# Patient Record
Sex: Male | Born: 1985 | Race: White | Hispanic: No | Marital: Married
Health system: Southern US, Community
[De-identification: ages and names within clinical notes are randomized; demographics above are authoritative.]

---

## 2021-10-05 NOTE — Progress Notes (Signed)
Erroneous encounter

## 2021-10-14 ENCOUNTER — Other Ambulatory Visit: Payer: Self-pay | Admitting: Physician Assistant

## 2021-10-14 ENCOUNTER — Encounter: Payer: Self-pay | Admitting: Family

## 2021-10-14 DIAGNOSIS — M25511 Pain in right shoulder: Secondary | ICD-10-CM

## 2021-10-14 DIAGNOSIS — Z7689 Persons encountering health services in other specified circumstances: Secondary | ICD-10-CM

## 2021-10-25 ENCOUNTER — Other Ambulatory Visit: Payer: Self-pay

## 2021-10-25 ENCOUNTER — Other Ambulatory Visit: Payer: No Typology Code available for payment source

## 2021-10-25 ENCOUNTER — Other Ambulatory Visit: Payer: Self-pay | Admitting: Physician Assistant

## 2021-10-25 ENCOUNTER — Ambulatory Visit
Admission: RE | Admit: 2021-10-25 | Discharge: 2021-10-25 | Disposition: A | Discharge status: Home / Self Care | Payer: No Typology Code available for payment source | Source: Ambulatory Visit | Attending: Physician Assistant | Admitting: Physician Assistant

## 2021-10-25 DIAGNOSIS — R52 Pain, unspecified: Secondary | ICD-10-CM

## 2021-11-11 ENCOUNTER — Other Ambulatory Visit: Payer: Self-pay | Admitting: Family

## 2021-11-11 ENCOUNTER — Other Ambulatory Visit: Payer: Self-pay | Admitting: Nurse Practitioner

## 2021-11-11 DIAGNOSIS — M25511 Pain in right shoulder: Secondary | ICD-10-CM

## 2021-11-15 ENCOUNTER — Ambulatory Visit
Admission: RE | Admit: 2021-11-15 | Discharge: 2021-11-15 | Disposition: A | Discharge status: Home / Self Care | Payer: No Typology Code available for payment source | Source: Ambulatory Visit | Attending: Family | Admitting: Family

## 2021-11-15 ENCOUNTER — Other Ambulatory Visit: Payer: Self-pay | Admitting: Family

## 2021-11-15 DIAGNOSIS — M25511 Pain in right shoulder: Secondary | ICD-10-CM

## 2021-11-18 ENCOUNTER — Other Ambulatory Visit: Payer: No Typology Code available for payment source

## 2022-03-06 ENCOUNTER — Emergency Department (HOSPITAL_COMMUNITY): Payer: No Typology Code available for payment source

## 2022-03-06 ENCOUNTER — Emergency Department (HOSPITAL_COMMUNITY)
Admission: EM | Admit: 2022-03-06 | Discharge: 2022-03-07 | Discharge status: Against Medical Advice | Payer: No Typology Code available for payment source | Attending: Emergency Medicine | Admitting: Emergency Medicine

## 2022-03-06 DIAGNOSIS — F17210 Nicotine dependence, cigarettes, uncomplicated: Secondary | ICD-10-CM | POA: Diagnosis not present

## 2022-03-06 DIAGNOSIS — R5383 Other fatigue: Secondary | ICD-10-CM | POA: Insufficient documentation

## 2022-03-06 DIAGNOSIS — R0789 Other chest pain: Secondary | ICD-10-CM | POA: Diagnosis not present

## 2022-03-06 DIAGNOSIS — M549 Dorsalgia, unspecified: Secondary | ICD-10-CM | POA: Insufficient documentation

## 2022-03-06 DIAGNOSIS — Z5321 Procedure and treatment not carried out due to patient leaving prior to being seen by health care provider: Secondary | ICD-10-CM | POA: Insufficient documentation

## 2022-03-06 DIAGNOSIS — K921 Melena: Secondary | ICD-10-CM | POA: Diagnosis not present

## 2022-03-06 NOTE — ED Provider Triage Note (Signed)
  Emergency Medicine Provider Triage Evaluation Note  MRN:  [id number]  Arrival date & time: 03/06/22    Medically screening exam initiated at 11:39 PM.   CC:   Chest Pain  HPI:  Peter Mitchell is a 36 y.o. year-old male presents to the ED with chief complaint of chest pain and tightness that started this evening.  Denies SOB.  States that he has some pains in his back.  Denies fever.  Took ibuprofen.  History provided by patient. ROS:  -As included in HPI PE:   Vitals:   03/06/22 2330  BP: 123/89  Pulse: (!) 107  Resp: 20  Temp: 98.2 F (36.8 C)  SpO2: 97%    Non-toxic appearing No respiratory distress  MDM:  Based on signs and symptoms, ACS is highest on my differential, followed by PE. I've ordered labs in triage to expedite lab/diagnostic workup.  Patient was informed that the remainder of the evaluation will be completed by another provider, this initial triage assessment does not replace that evaluation, and the importance of remaining in the ED until their evaluation is complete.    Roxy Horseman, PA-C 03/06/22 2341

## 2022-03-06 NOTE — ED Triage Notes (Signed)
Pt c/o L sided CP & pain between shoulder blades onset 1hr ago. Also endorses increased fatigue & recent stressors. Familial hx- father MI in 13's. Pt is 1PPD smoker  Pt also endorses 2 episodes of black tarry stool

## 2022-03-07 LAB — CBC
HCT: 46.6 % (ref 39.0–52.0)
Hemoglobin: 15.7 g/dL (ref 13.0–17.0)
MCH: 31.4 pg (ref 26.0–34.0)
MCHC: 33.7 g/dL (ref 30.0–36.0)
MCV: 93.2 fL (ref 80.0–100.0)
Platelets: 310 10*3/uL (ref 150–400)
RBC: 5 MIL/uL (ref 4.22–5.81)
RDW: 12.3 % (ref 11.5–15.5)
WBC: 9 10*3/uL (ref 4.0–10.5)
nRBC: 0 % (ref 0.0–0.2)

## 2022-03-07 LAB — BASIC METABOLIC PANEL
Anion gap: 8 (ref 5–15)
BUN: 12 mg/dL (ref 6–20)
CO2: 24 mmol/L (ref 22–32)
Calcium: 9.2 mg/dL (ref 8.9–10.3)
Chloride: 107 mmol/L (ref 98–111)
Creatinine, Ser: 0.85 mg/dL (ref 0.61–1.24)
GFR, Estimated: >60 mL/min (ref >60)
Glucose, Bld: 171 mg/dL — ABNORMAL HIGH (ref 70–99)
Potassium: 4 mmol/L (ref 3.5–5.1)
Sodium: 139 mmol/L (ref 135–145)

## 2022-03-07 LAB — TROPONIN I (HIGH SENSITIVITY)
Troponin I (High Sensitivity): 3 ng/L (ref <18)
Troponin I (High Sensitivity): 3 ng/L (ref <18)

## 2022-03-07 NOTE — ED Notes (Signed)
Pt left to go home  

## 2022-08-06 ENCOUNTER — Emergency Department (HOSPITAL_COMMUNITY)
Admission: EM | Admit: 2022-08-06 | Discharge: 2022-08-06 | Disposition: A | Discharge status: Home / Self Care | Payer: No Typology Code available for payment source | Attending: Emergency Medicine | Admitting: Emergency Medicine

## 2022-08-06 ENCOUNTER — Encounter (HOSPITAL_COMMUNITY): Payer: Self-pay

## 2022-08-06 DIAGNOSIS — L02211 Cutaneous abscess of abdominal wall: Secondary | ICD-10-CM | POA: Diagnosis present

## 2022-08-06 DIAGNOSIS — L0291 Cutaneous abscess, unspecified: Secondary | ICD-10-CM

## 2022-08-06 MED ORDER — DOXYCYCLINE HYCLATE 100 MG PO CAPS: 100.0000 mg | ORAL_CAPSULE | Freq: Two times a day (BID) | ORAL | 0 refills | Status: AC

## 2022-08-06 NOTE — ED Provider Notes (Signed)
[facility]-EMERGENCY DEPT Provider Note   CSN: [id number] Arrival date & time: 08/06/22  1545     History  Chief Complaint  Patient presents with   Abscess    Peter Mitchell is a 36 y.o. male.  Patient with no pertinent past medical history presents today with complaints of right-sided abdominal abscess.  He states that same has been present for over a week and ruptured a few days ago.  States that same is continued to ooze intermittently.  States he has had abscesses in the past and would like antibiotics. He denies fevers, chills, nausea, vomiting, or diarrhea.  The history is provided by the patient. No language interpreter was used.  Abscess      Home Medications Prior to Admission medications   Not on File      Allergies    Patient has no known allergies.    Review of Systems   Review of Systems  Skin:  Positive for wound.  All other systems reviewed and are negative.   Physical Exam Updated Vital Signs BP 127/76 (BP Location: Left Arm)   Pulse 92   Temp 98.1 F (36.7 C) (Oral)   Resp 16   SpO2 98%  Physical Exam Vitals and nursing note reviewed.  Constitutional:      General: He is not in acute distress.    Appearance: Normal appearance. He is normal weight. He is not ill-appearing, toxic-appearing or diaphoretic.  HENT:     Head: Normocephalic and atraumatic.  Cardiovascular:     Rate and Rhythm: Normal rate.  Pulmonary:     Effort: Pulmonary effort is normal. No respiratory distress.  Musculoskeletal:        General: Normal range of motion.     Cervical back: Normal range of motion.  Skin:    General: Skin is warm and dry.     Comments: Small superficial wound noted to the left anterior abdomen with trace purulent drainage consistent with an ruptured abscess. Q-tip placed in the wound which touches the base approximately 1 mm in depth. Same probed and deloculated without additional drainage. Minimal surrounding  induration  Neurological:     General: No focal deficit present.     Mental Status: He is alert.  Psychiatric:        Mood and Affect: Mood normal.        Behavior: Behavior normal.     ED Results / Procedures / Treatments   Labs (all labs ordered are listed, but only abnormal results are displayed) Labs Reviewed - No data to display  EKG None  Radiology No results found.  Procedures Procedures    Medications Ordered in ED Medications - No data to display  ED Course/ Medical Decision Making/ A&P                           Medical Decision Making  Patient presents today with complaints of left superficial abdominal abscess.  He is afebrile, nontoxic-appearing, in no acute distress with reassuring vital signs.  Physical exam reveals superficial lesion which is open and has trace amounts of purulence consistent with a superficial abscess.  I did place a Q-tip in the wound and probed and deloculated approximately 1 mm in depth.  No concern for tracking abscess into the abdominal cavity. Will give doxycycline for antibiotic coverage per patients request. I  have also given the patient Q-tips to continue to probe and deloculate within the wound  to ensure adequate healing.  I did consider further evaluation left labs and CT imaging, however clearly superficial in nature.  Therefore will defer additional workup at this time.  Patient is stable for discharge.  He is understanding and amenable with plan, educated on red flag symptoms that would prompt immediate return.  Emphasized importance of close follow-up for wound check.  Patient discharged in stable condition.   Final Clinical Impression(s) / ED Diagnoses Final diagnoses:  Abscess    Rx / DC Orders ED Discharge Orders          Ordered    doxycycline (VIBRAMYCIN) 100 MG capsule  2 times daily        08/06/22 1711          An After Visit Summary was printed and given to the patient.     Vear Clock 08/06/22  1713    Ernie Avena, MD 08/06/22 2135

## 2022-08-06 NOTE — ED Provider Triage Note (Signed)
Emergency Medicine Provider Triage Evaluation Note  Peter Mitchell , a 36 y.o. male  was evaluated in triage.  Pt complains of abscess in the lower right portion of his abdomen.  Patient states has been some drainage and the wound had scabbed over.  He states he excellently knocked the scab loose today.  He is here with concerns about antibiotic coverage  Review of Systems  Positive: As above Negative: As above  Physical Exam  BP 127/76 (BP Location: Left Arm)   Pulse 92   Temp 98.1 F (36.7 C) (Oral)   Resp 16   SpO2 98%  Gen:   Awake, no distress   Resp:  Normal effort  MSK:   Moves extremities without difficulty  Other:    Medical Decision Making  Medically screening exam initiated at 4:33 PM.  Appropriate orders placed.  Peter Mitchell was informed that the remainder of the evaluation will be completed by another provider, this initial triage assessment does not replace that evaluation, and the importance of remaining in the ED until their evaluation is complete.     Darrick Grinder, PA-C 08/06/22 1635

## 2022-08-06 NOTE — Discharge Instructions (Addendum)
As we discussed, I have given you a prescription for an antibiotic for you to take as prescribed in its entirety to help with the healing of your skin abscess.  I have also given you Q-tips to place in the wound every day to ensure adequate healing.  Please follow-up with your primary care doctor in the next few days for a wound check.  Return if development of any new or worsening symptoms.

## 2023-08-17 IMAGING — CR DG SHOULDER 2+V*R*
4 series · 4 of 4 positions shown · non-contrast
Comparison: None.

CLINICAL DATA: Right shoulder pain, limited range of motion for 1
year

EXAM:
RIGHT SHOULDER - 2+ VIEW

[w shoulder grashey right]
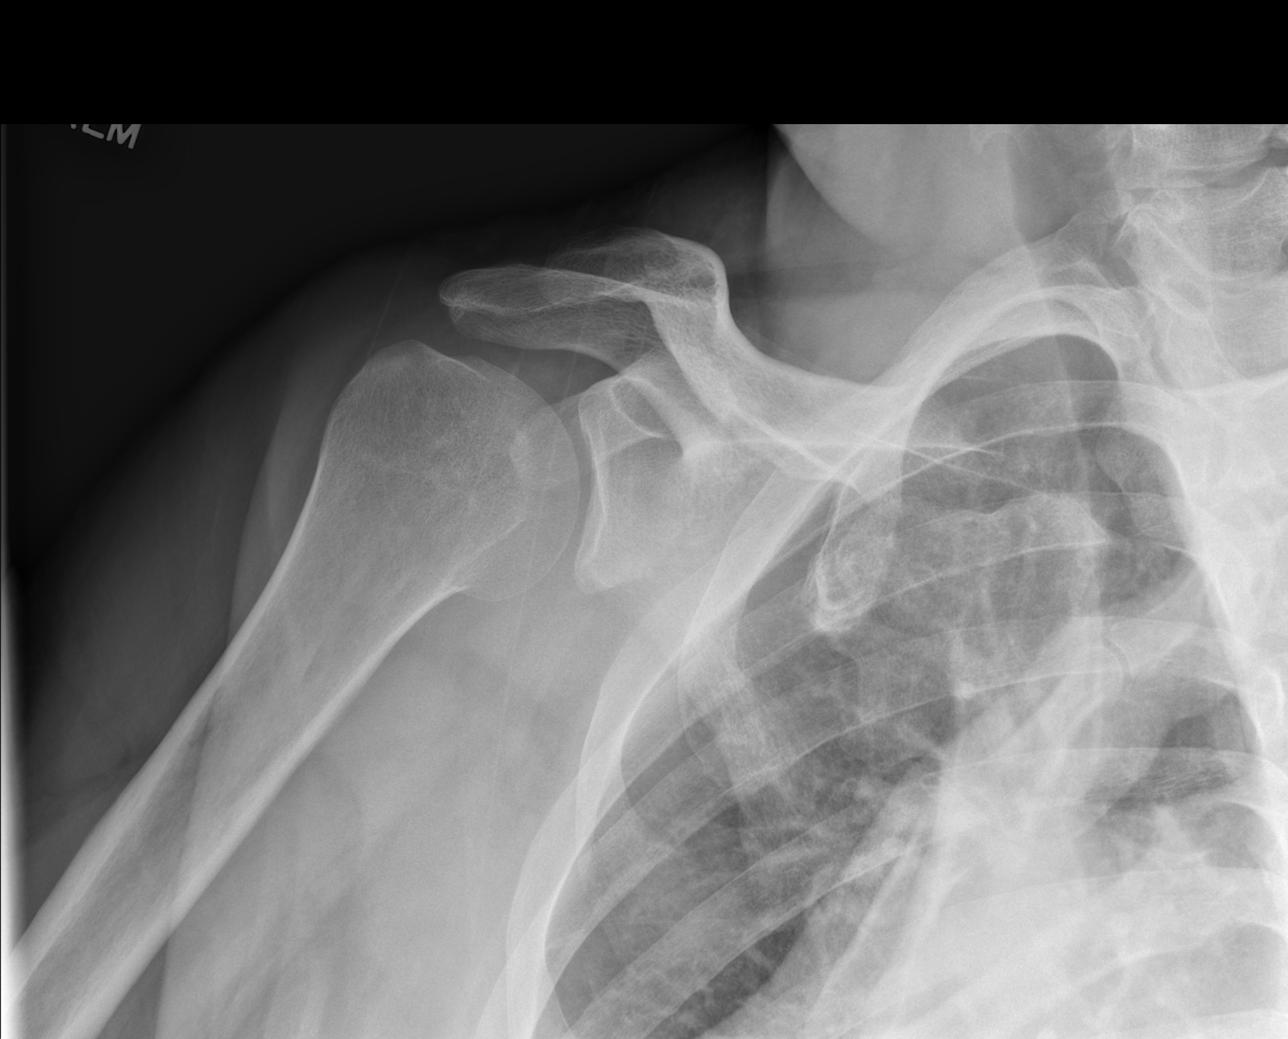

[w shoulder y-view right]
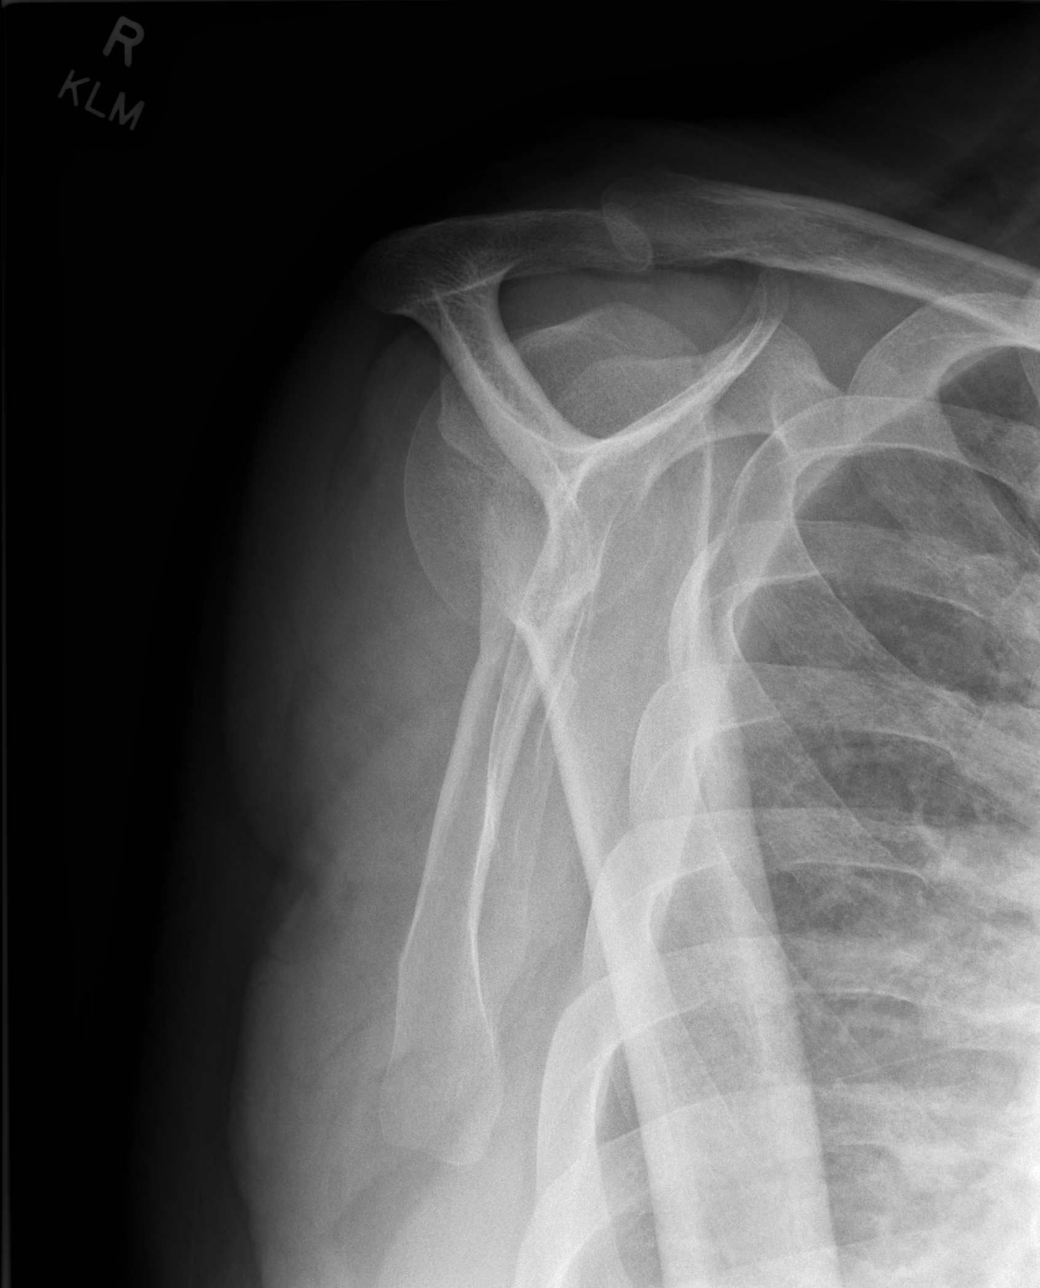

[x shoulder axillary right (1 of 2)]
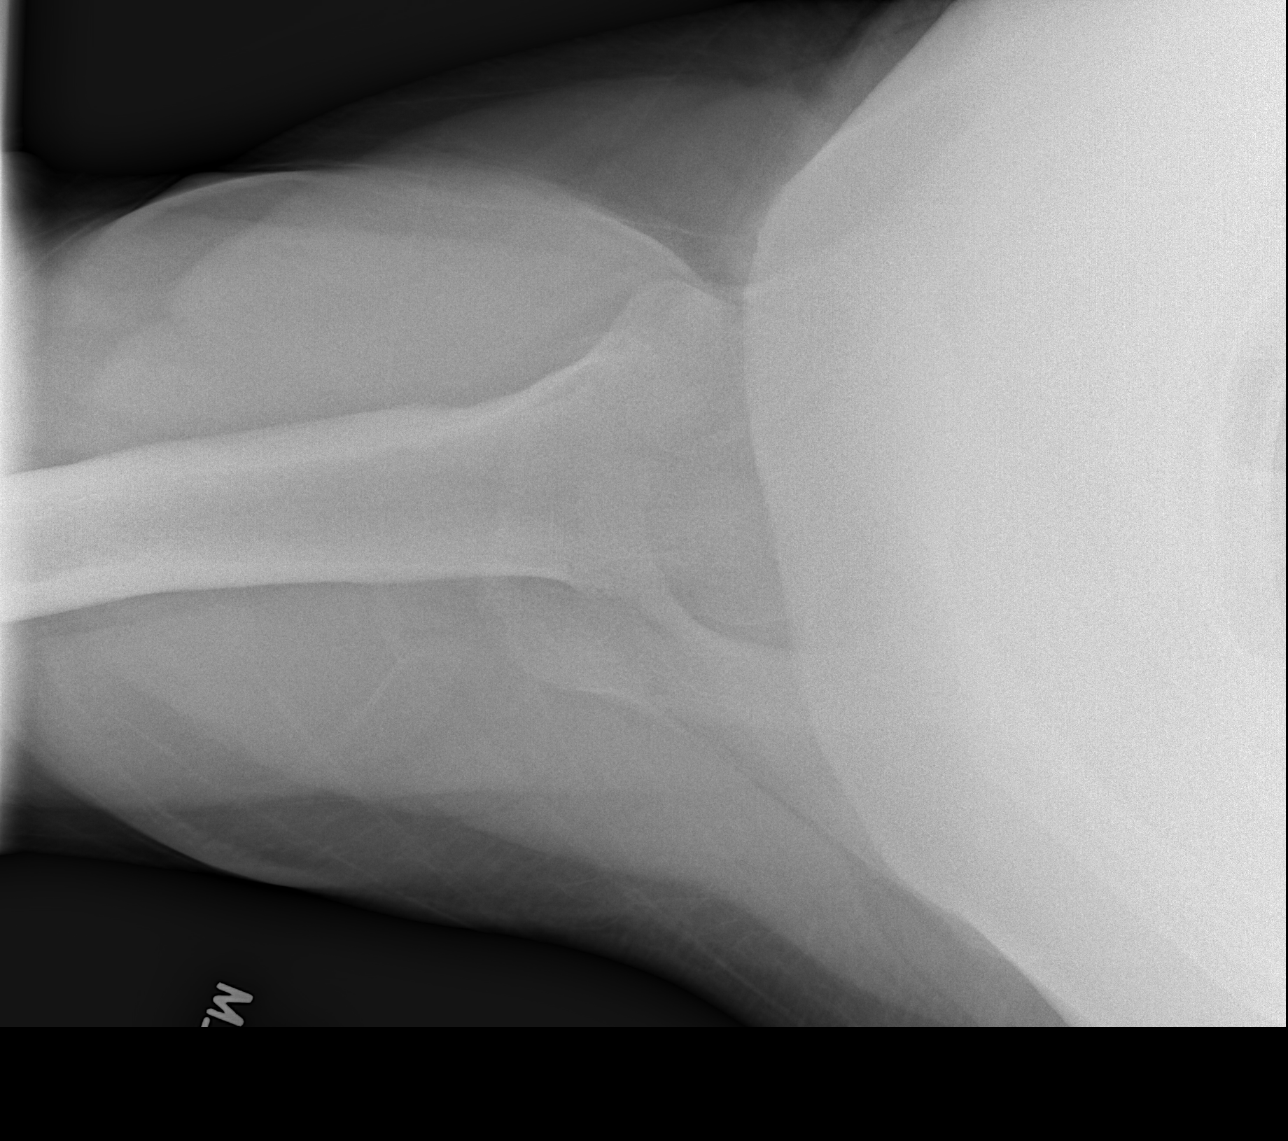

[x shoulder axillary right (2 of 2)]
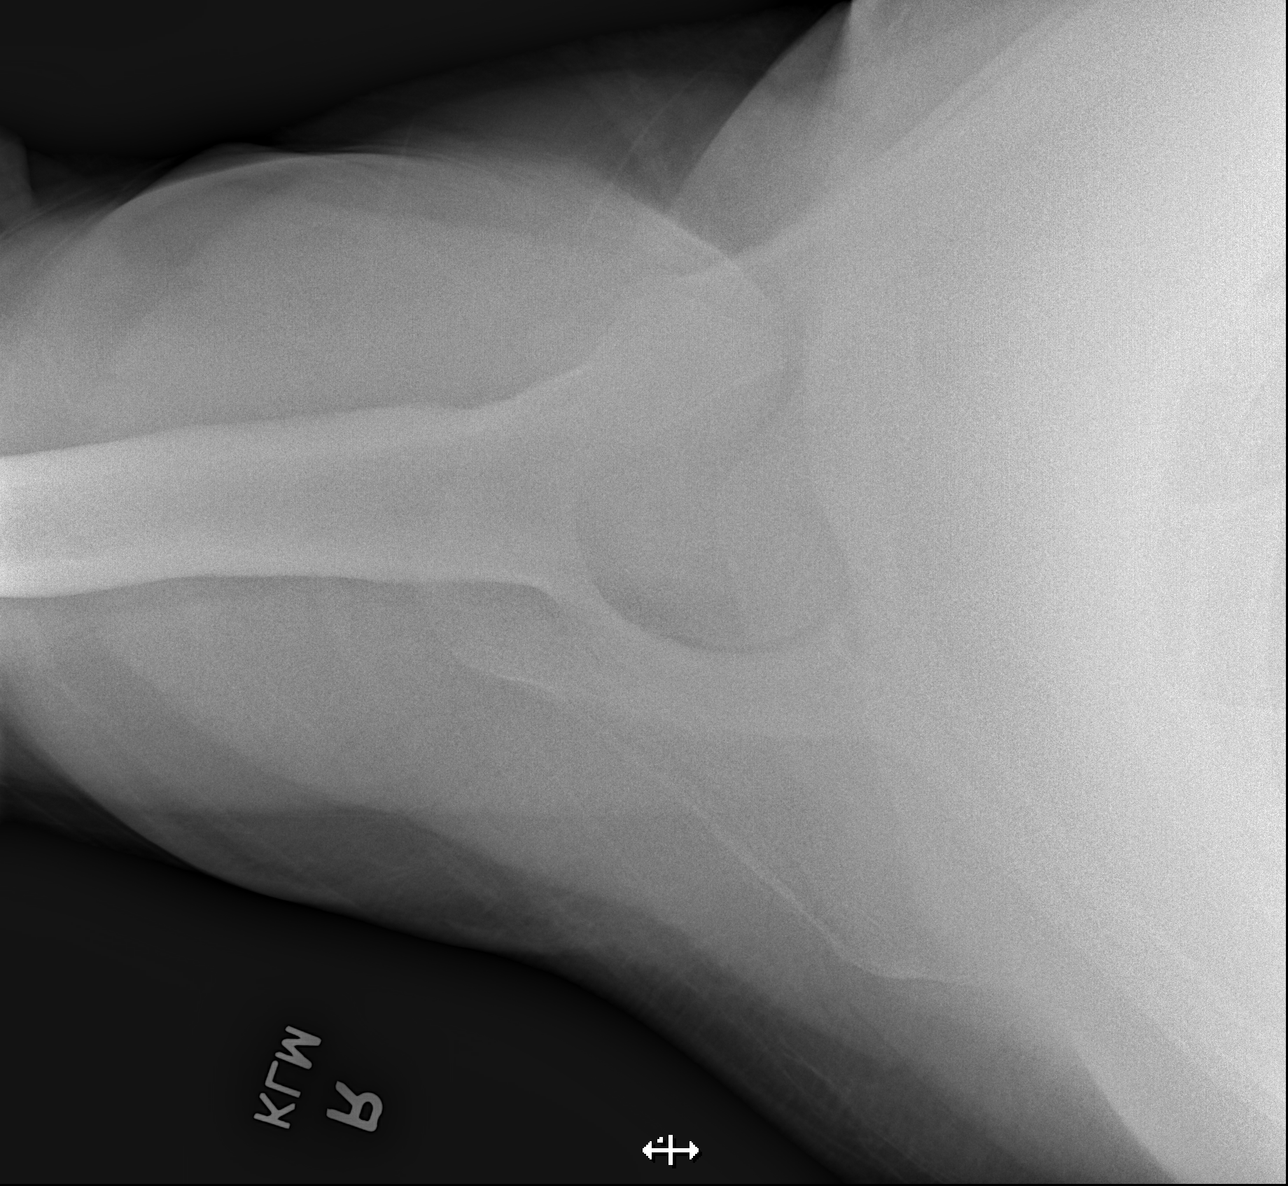

[4 of 4 positions shown; findings below may reference images not displayed]

FINDINGS: Frontal, transscapular, and axillary views are obtained. No
fracture, subluxation, or dislocation. Joint spaces are well
preserved. Right chest is clear.
IMPRESSION: 1. Unremarkable right shoulder.
